# Patient Record
Sex: Female | Born: 1935 | Hispanic: No | Marital: Single
Health system: Southern US, Community
[De-identification: ages and names within clinical notes are randomized; demographics above are authoritative.]

## PROBLEM LIST (undated history)

## (undated) DIAGNOSIS — R569 Unspecified convulsions: Secondary | ICD-10-CM

## (undated) DIAGNOSIS — I10 Essential (primary) hypertension: Secondary | ICD-10-CM

---

## 2018-09-01 ENCOUNTER — Emergency Department (HOSPITAL_COMMUNITY): Payer: Self-pay

## 2018-09-01 ENCOUNTER — Emergency Department (HOSPITAL_COMMUNITY)
Admission: EM | Admit: 2018-09-01 | Discharge: 2018-09-02 | Disposition: A | Discharge status: Home / Self Care | Payer: Self-pay | Attending: Emergency Medicine | Admitting: Emergency Medicine

## 2018-09-01 ENCOUNTER — Other Ambulatory Visit: Payer: Self-pay

## 2018-09-01 ENCOUNTER — Encounter (HOSPITAL_COMMUNITY): Payer: Self-pay | Admitting: Emergency Medicine

## 2018-09-01 DIAGNOSIS — Y92008 Other place in unspecified non-institutional (private) residence as the place of occurrence of the external cause: Secondary | ICD-10-CM | POA: Insufficient documentation

## 2018-09-01 DIAGNOSIS — W19XXXA Unspecified fall, initial encounter: Secondary | ICD-10-CM

## 2018-09-01 DIAGNOSIS — S0083XA Contusion of other part of head, initial encounter: Secondary | ICD-10-CM | POA: Insufficient documentation

## 2018-09-01 DIAGNOSIS — I10 Essential (primary) hypertension: Secondary | ICD-10-CM | POA: Insufficient documentation

## 2018-09-01 DIAGNOSIS — W07XXXA Fall from chair, initial encounter: Secondary | ICD-10-CM | POA: Insufficient documentation

## 2018-09-01 DIAGNOSIS — S60221A Contusion of right hand, initial encounter: Secondary | ICD-10-CM | POA: Insufficient documentation

## 2018-09-01 DIAGNOSIS — Y9389 Activity, other specified: Secondary | ICD-10-CM | POA: Insufficient documentation

## 2018-09-01 DIAGNOSIS — Y999 Unspecified external cause status: Secondary | ICD-10-CM | POA: Insufficient documentation

## 2018-09-01 HISTORY — DX: Essential (primary) hypertension: I10

## 2018-09-01 HISTORY — DX: Unspecified convulsions: R56.9

## 2018-09-01 LAB — CBC WITH DIFFERENTIAL/PLATELET
ABS IMMATURE GRANULOCYTES: 0.03 10*3/uL (ref 0.00–0.07)
BASOS PCT: 1 %
Basophils Absolute: 0 10*3/uL (ref 0.0–0.1)
EOS ABS: 0.2 10*3/uL (ref 0.0–0.5)
Eosinophils Relative: 3 %
HCT: 38.7 % (ref 36.0–46.0)
Hemoglobin: 12.9 g/dL (ref 12.0–15.0)
Immature Granulocytes: 0 %
Lymphocytes Relative: 33 %
Lymphs Abs: 2.3 10*3/uL (ref 0.7–4.0)
MCH: 30.6 pg (ref 26.0–34.0)
MCHC: 33.3 g/dL (ref 30.0–36.0)
MCV: 91.9 fL (ref 80.0–100.0)
MONO ABS: 0.6 10*3/uL (ref 0.1–1.0)
MONOS PCT: 9 %
NEUTROS ABS: 3.8 10*3/uL (ref 1.7–7.7)
Neutrophils Relative %: 54 %
PLATELETS: 197 10*3/uL (ref 150–400)
RBC: 4.21 MIL/uL (ref 3.87–5.11)
RDW: 12.6 % (ref 11.5–15.5)
WBC: 7 10*3/uL (ref 4.0–10.5)
nRBC: 0 % (ref 0.0–0.2)

## 2018-09-01 LAB — COMPREHENSIVE METABOLIC PANEL
ALK PHOS: 96 U/L (ref 38–126)
ALT: 20 U/L (ref 0–44)
ANION GAP: 8 (ref 5–15)
AST: 24 U/L (ref 15–41)
Albumin: 3.4 g/dL — ABNORMAL LOW (ref 3.5–5.0)
BILIRUBIN TOTAL: 0.3 mg/dL (ref 0.3–1.2)
BUN: 13 mg/dL (ref 8–23)
CALCIUM: 8.6 mg/dL — AB (ref 8.9–10.3)
CO2: 28 mmol/L (ref 22–32)
Chloride: 103 mmol/L (ref 98–111)
Creatinine, Ser: 0.7 mg/dL (ref 0.44–1.00)
GFR calc Af Amer: >60 mL/min (ref >60)
GLUCOSE: 158 mg/dL — AB (ref 70–99)
POTASSIUM: 3.2 mmol/L — AB (ref 3.5–5.1)
Sodium: 139 mmol/L (ref 135–145)
TOTAL PROTEIN: 6.2 g/dL — AB (ref 6.5–8.1)

## 2018-09-01 LAB — URINALYSIS, ROUTINE W REFLEX MICROSCOPIC
Bilirubin Urine: NEGATIVE
GLUCOSE, UA: NEGATIVE mg/dL
HGB URINE DIPSTICK: NEGATIVE
Ketones, ur: NEGATIVE mg/dL
Leukocytes, UA: NEGATIVE
Nitrite: NEGATIVE
PROTEIN: NEGATIVE mg/dL
Specific Gravity, Urine: 1.003 — ABNORMAL LOW (ref 1.005–1.030)
pH: 7 (ref 5.0–8.0)

## 2018-09-01 LAB — I-STAT TROPONIN, ED: TROPONIN I, POC: 0 ng/mL (ref 0.00–0.08)

## 2018-09-01 NOTE — ED Provider Notes (Signed)
[facility]-EMERGENCY DEPT Provider Note   CSN: [id number] Arrival date & time: 09/01/18  1913     History   Chief Complaint Chief Complaint  Patient presents with  . Fall    HPI Keaunna Skipper is a 82 y.o. female.  The history is provided by the patient and a relative. No language interpreter was used.   Tyrina Hines is a 82 y.o. female who presents to the Emergency Department complaining of fall.  Level V caveat due to language barrier and confusion. History is provided by the patient's niece. The patient was getting up off of her couch and using her hand to support her when the couch slipped and she fell forward, hitting her head on the ground. The patient reports pain to her face and right hand. No nausea, loss of consciousness. Her knee states that she has been seeming a little bit confused stating that there is an election this weekend as well as saying that she forgot to have the roommate feed the dogs when the roommate has already fed the dogs. On ED arrival the patient is at her baseline. No reports of recent illnesses. She does have a history of hypertension, seizures and PVD. She is on pletal. She is followed by vascular surgery and PCP at [facility]. Past Medical History:  Diagnosis Date  . Hypertension   . Seizures (HCC)     There are no active problems to display for this patient.      OB History   None      Home Medications    Prior to Admission medications   Not on File    Family History No family history on file.  Social History Social History   Tobacco Use  . Smoking status: Not on file  Substance Use Topics  . Alcohol use: Not on file  . Drug use: Not on file     Allergies   Patient has no known allergies.   Review of Systems Review of Systems  All other systems reviewed and are negative.    Physical Exam Updated Vital Signs BP (!) 159/81 (BP Location: Right Arm) Comment: Pt. has history of high  BP and took her medications in the morning. Told the nurse about pts. high BP  Pulse 73   Temp 97.7 F (36.5 C) (Oral)   Resp 18   SpO2 97%   Physical Exam  Constitutional: She is oriented to person, place, and time. She appears well-developed and well-nourished.  HENT:  Head: Normocephalic.  Erythema and tenderness to the right cheek  Cardiovascular: Normal rate and regular rhythm.  No murmur heard. Pulmonary/Chest: Effort normal and breath sounds normal. No respiratory distress.  Abdominal: Soft. There is no tenderness. There is no rebound and no guarding.  Musculoskeletal:  Scarring and deformity to the left shin.  Neurological: She is alert and oriented to person, place, and time.  Five out of five strength in all four extremities with sensation to light touch intact in all four extremities. No facial asymmetry.  Skin: Skin is warm and dry.  Psychiatric: She has a normal mood and affect. Her behavior is normal.  Nursing note and vitals reviewed.    ED Treatments / Results  Labs (all labs ordered are listed, but only abnormal results are displayed) Labs Reviewed  COMPREHENSIVE METABOLIC PANEL - Abnormal; Notable for the following components:      Result Value   Potassium 3.2 (*)    Glucose, Bld 158 (*)  Calcium 8.6 (*)    Total Protein 6.2 (*)    Albumin 3.4 (*)    All other components within normal limits  URINALYSIS, ROUTINE W REFLEX MICROSCOPIC - Abnormal; Notable for the following components:   Color, Urine COLORLESS (*)    Specific Gravity, Urine 1.003 (*)    All other components within normal limits  CBC WITH DIFFERENTIAL/PLATELET  I-STAT TROPONIN, ED    EKG EKG Interpretation  Date/Time:  Friday September 01 2018 21:04:16 EDT Ventricular Rate:  82 PR Interval:    QRS Duration: 86 QT Interval:  388 QTC Calculation: 453 R Axis:   -76 Text Interpretation:  Atrial fibrillation Left axis deviation T wave abnormality, consider anterior ischemia Abnormal ECG  no prior available for comparison Confirmed by Tilden Fossa [phone]) on 09/01/2018 9:20:49 PM   Radiology Ct Head Wo Contrast  Result Date: 09/01/2018 CLINICAL DATA:  82 year old female with trauma. EXAM: CT HEAD WITHOUT CONTRAST CT MAXILLOFACIAL WITHOUT CONTRAST CT CERVICAL SPINE WITHOUT CONTRAST TECHNIQUE: Multidetector CT imaging of the head, cervical spine, and maxillofacial structures were performed using the standard protocol without intravenous contrast. Multiplanar CT image reconstructions of the cervical spine and maxillofacial structures were also generated. COMPARISON:  None. FINDINGS: CT HEAD FINDINGS Brain: Mild age-related atrophy and chronic microvascular ischemic changes. There is no acute intracranial hemorrhage. No mass effect or midline shift. No extra-axial fluid collection. Vascular: No hyperdense vessel or unexpected calcification. Skull: Normal. Negative for fracture or focal lesion. Other: None CT MAXILLOFACIAL FINDINGS Osseous: No fracture or mandibular dislocation. No destructive process. Orbits: Negative. No traumatic or inflammatory finding. Sinuses: Mild mucoperiosteal thickening of paranasal sinuses. No air-fluid levels. The mastoid air cells are clear. Soft tissues: Negative. CT CERVICAL SPINE FINDINGS Alignment: No acute subluxation. Skull base and vertebrae: No acute fracture. Osteopenia. Chronic changes with decreased vertebral body height. Soft tissues and spinal canal: No prevertebral fluid or swelling. No visible canal hematoma. Disc levels: Degenerative changes primarily at C5-C6 with endplate irregularity and disc space narrowing. Upper chest: Biapical subpleural scarring. A partially visualized nodular soft tissue density anterior to the right T5 vertebra measuring approximately 7 x 16 mm (series 9, image 97) is not well evaluated. Although this may represent a partially visualized paravertebral soft tissues or vasculature, a mass is not entirely excluded. This can be  further evaluated with a thoracic spine MRI on a nonemergent basis. There is heterogeneity of the thyroid gland. Other: None IMPRESSION: 1. No acute intracranial hemorrhage. Mild age-related atrophy and chronic microvascular ischemic changes. 2. No acute facial bone fractures. 3. No acute cervical spine fracture. 4. Partially visualized nodular soft tissue density anterior to the right T5 vertebra. Although this may represent a partially visualized paravertebral soft tissues or vasculature, a mass is not entirely excluded. This can be further evaluated with a thoracic spine MRI on a nonemergent basis. Electronically Signed   By: Elgie Collard M.D.   On: 09/01/2018 22:32   Ct Cervical Spine Wo Contrast  Result Date: 09/01/2018 CLINICAL DATA:  82 year old female with trauma. EXAM: CT HEAD WITHOUT CONTRAST CT MAXILLOFACIAL WITHOUT CONTRAST CT CERVICAL SPINE WITHOUT CONTRAST TECHNIQUE: Multidetector CT imaging of the head, cervical spine, and maxillofacial structures were performed using the standard protocol without intravenous contrast. Multiplanar CT image reconstructions of the cervical spine and maxillofacial structures were also generated. COMPARISON:  None. FINDINGS: CT HEAD FINDINGS Brain: Mild age-related atrophy and chronic microvascular ischemic changes. There is no acute intracranial hemorrhage. No mass effect or midline shift. No extra-axial  fluid collection. Vascular: No hyperdense vessel or unexpected calcification. Skull: Normal. Negative for fracture or focal lesion. Other: None CT MAXILLOFACIAL FINDINGS Osseous: No fracture or mandibular dislocation. No destructive process. Orbits: Negative. No traumatic or inflammatory finding. Sinuses: Mild mucoperiosteal thickening of paranasal sinuses. No air-fluid levels. The mastoid air cells are clear. Soft tissues: Negative. CT CERVICAL SPINE FINDINGS Alignment: No acute subluxation. Skull base and vertebrae: No acute fracture. Osteopenia. Chronic  changes with decreased vertebral body height. Soft tissues and spinal canal: No prevertebral fluid or swelling. No visible canal hematoma. Disc levels: Degenerative changes primarily at C5-C6 with endplate irregularity and disc space narrowing. Upper chest: Biapical subpleural scarring. A partially visualized nodular soft tissue density anterior to the right T5 vertebra measuring approximately 7 x 16 mm (series 9, image 97) is not well evaluated. Although this may represent a partially visualized paravertebral soft tissues or vasculature, a mass is not entirely excluded. This can be further evaluated with a thoracic spine MRI on a nonemergent basis. There is heterogeneity of the thyroid gland. Other: None IMPRESSION: 1. No acute intracranial hemorrhage. Mild age-related atrophy and chronic microvascular ischemic changes. 2. No acute facial bone fractures. 3. No acute cervical spine fracture. 4. Partially visualized nodular soft tissue density anterior to the right T5 vertebra. Although this may represent a partially visualized paravertebral soft tissues or vasculature, a mass is not entirely excluded. This can be further evaluated with a thoracic spine MRI on a nonemergent basis. Electronically Signed   By: Elgie Collard M.D.   On: 09/01/2018 22:32   Dg Hand Complete Right  Result Date: 09/01/2018 CLINICAL DATA:  Patient fell this am, pain first metacarpal area EXAM: RIGHT HAND - COMPLETE 3+ VIEW COMPARISON:  None. FINDINGS: There is no acute fracture or subluxation. There is degenerative change of the interphalangeal joints, particularly at the fourth proximal interphalangeal joint. No radiopaque foreign body or soft tissue gas. IMPRESSION: No evidence for acute  abnormality. Electronically Signed   By: Norva Pavlov M.D.   On: 09/01/2018 21:26   Ct Maxillofacial Wo Cm  Result Date: 09/01/2018 CLINICAL DATA:  82 year old female with trauma. EXAM: CT HEAD WITHOUT CONTRAST CT MAXILLOFACIAL WITHOUT  CONTRAST CT CERVICAL SPINE WITHOUT CONTRAST TECHNIQUE: Multidetector CT imaging of the head, cervical spine, and maxillofacial structures were performed using the standard protocol without intravenous contrast. Multiplanar CT image reconstructions of the cervical spine and maxillofacial structures were also generated. COMPARISON:  None. FINDINGS: CT HEAD FINDINGS Brain: Mild age-related atrophy and chronic microvascular ischemic changes. There is no acute intracranial hemorrhage. No mass effect or midline shift. No extra-axial fluid collection. Vascular: No hyperdense vessel or unexpected calcification. Skull: Normal. Negative for fracture or focal lesion. Other: None CT MAXILLOFACIAL FINDINGS Osseous: No fracture or mandibular dislocation. No destructive process. Orbits: Negative. No traumatic or inflammatory finding. Sinuses: Mild mucoperiosteal thickening of paranasal sinuses. No air-fluid levels. The mastoid air cells are clear. Soft tissues: Negative. CT CERVICAL SPINE FINDINGS Alignment: No acute subluxation. Skull base and vertebrae: No acute fracture. Osteopenia. Chronic changes with decreased vertebral body height. Soft tissues and spinal canal: No prevertebral fluid or swelling. No visible canal hematoma. Disc levels: Degenerative changes primarily at C5-C6 with endplate irregularity and disc space narrowing. Upper chest: Biapical subpleural scarring. A partially visualized nodular soft tissue density anterior to the right T5 vertebra measuring approximately 7 x 16 mm (series 9, image 97) is not well evaluated. Although this may represent a partially visualized paravertebral soft tissues or vasculature, a  mass is not entirely excluded. This can be further evaluated with a thoracic spine MRI on a nonemergent basis. There is heterogeneity of the thyroid gland. Other: None IMPRESSION: 1. No acute intracranial hemorrhage. Mild age-related atrophy and chronic microvascular ischemic changes. 2. No acute facial  bone fractures. 3. No acute cervical spine fracture. 4. Partially visualized nodular soft tissue density anterior to the right T5 vertebra. Although this may represent a partially visualized paravertebral soft tissues or vasculature, a mass is not entirely excluded. This can be further evaluated with a thoracic spine MRI on a nonemergent basis. Electronically Signed   By: Elgie Collard M.D.   On: 09/01/2018 22:32    Procedures Procedures (including critical care time)  Medications Ordered in ED Medications - No data to display   Initial Impression / Assessment and Plan / ED Course  I have reviewed the triage vital signs and the nursing notes.  Pertinent labs & imaging results that were available during my care of the patient were reviewed by me and considered in my medical decision making (see chart for details).     Patient with history of peripheral vascular disease here for evaluation of injuries following a mechanical fall. Family report that she was mildly confused prior to ED arrival but now at her baseline. Presentation is not consistent with CVA, sepsis. Labs demonstrate mild hypokalemia. Imaging does note indeterminate lesion near T5. Patient is asymptomatic. Plan to refer for outpatient follow-up. EKG does note atrial arrhythmia mixed with sinus rhythm, question paroxysmal atrial fibrillation. Patient is rate controlled and asymptomatic. No report of history of a fib. Plan to discharge home with close PCP follow-up and return precautions.  Final Clinical Impressions(s) / ED Diagnoses   Final diagnoses:  None    ED Discharge Orders    None       Tilden Fossa, MD 09/02/18 0011

## 2018-09-01 NOTE — ED Triage Notes (Addendum)
Patient BIB niece, states patient reports fall and c/o right side facial pain after hitting face on the floor. Niece reports patient is "saying a lot of nonsense." Patient denies LOC. Denies taking blood thinners. Patient is spanish speaking.

## 2018-09-02 MED ORDER — POTASSIUM CHLORIDE CRYS ER 20 MEQ PO TBCR
20.0000 meq | EXTENDED_RELEASE_TABLET | Freq: Once | ORAL | Status: AC
  Administered 2018-09-02: 20 meq via ORAL
  Filled 2018-09-02: qty 1

## 2018-09-02 NOTE — Discharge Instructions (Addendum)
Please follow up with Ms. Baugh family doctor regarding her fall.  She had a CT scan today that showed an irregularity in her back - please have this evaluated by her family doctor.  Her EKG today possible atrial fibrillation - please have her follow up with family doctor for recheck.  She can take tylenol for pain, available over the counter.

## 2018-10-27 ENCOUNTER — Emergency Department (HOSPITAL_COMMUNITY): Payer: Self-pay

## 2018-10-27 ENCOUNTER — Emergency Department (HOSPITAL_COMMUNITY)
Admission: EM | Admit: 2018-10-27 | Discharge: 2018-10-27 | Disposition: A | Discharge status: Home / Self Care | Payer: Self-pay | Attending: Emergency Medicine | Admitting: Emergency Medicine

## 2018-10-27 DIAGNOSIS — Z79899 Other long term (current) drug therapy: Secondary | ICD-10-CM | POA: Insufficient documentation

## 2018-10-27 DIAGNOSIS — F32A Depression, unspecified: Secondary | ICD-10-CM

## 2018-10-27 DIAGNOSIS — E860 Dehydration: Secondary | ICD-10-CM | POA: Insufficient documentation

## 2018-10-27 DIAGNOSIS — F329 Major depressive disorder, single episode, unspecified: Secondary | ICD-10-CM | POA: Insufficient documentation

## 2018-10-27 LAB — COMPREHENSIVE METABOLIC PANEL
ALT: 18 U/L (ref 0–44)
AST: 20 U/L (ref 15–41)
Albumin: 3.5 g/dL (ref 3.5–5.0)
Alkaline Phosphatase: 98 U/L (ref 38–126)
Anion gap: 11 (ref 5–15)
BUN: 14 mg/dL (ref 8–23)
CO2: 24 mmol/L (ref 22–32)
Calcium: 8.9 mg/dL (ref 8.9–10.3)
Chloride: 105 mmol/L (ref 98–111)
Creatinine, Ser: 0.67 mg/dL (ref 0.44–1.00)
GFR calc Af Amer: >60 mL/min (ref >60)
GFR calc non Af Amer: >60 mL/min (ref >60)
GLUCOSE: 117 mg/dL — AB (ref 70–99)
Potassium: 3.8 mmol/L (ref 3.5–5.1)
Sodium: 140 mmol/L (ref 135–145)
Total Bilirubin: 0.4 mg/dL (ref 0.3–1.2)
Total Protein: 6.5 g/dL (ref 6.5–8.1)

## 2018-10-27 LAB — URINALYSIS, ROUTINE W REFLEX MICROSCOPIC
Bilirubin Urine: NEGATIVE
Glucose, UA: NEGATIVE mg/dL
Hgb urine dipstick: NEGATIVE
Ketones, ur: NEGATIVE mg/dL
Leukocytes, UA: NEGATIVE
Nitrite: NEGATIVE
PH: 7 (ref 5.0–8.0)
Protein, ur: NEGATIVE mg/dL
Specific Gravity, Urine: 1.008 (ref 1.005–1.030)

## 2018-10-27 LAB — PROTIME-INR
INR: 1.01
PROTHROMBIN TIME: 13.2 s (ref 11.4–15.2)

## 2018-10-27 LAB — CBC WITH DIFFERENTIAL/PLATELET
Abs Immature Granulocytes: 0.03 10*3/uL (ref 0.00–0.07)
BASOS PCT: 0 %
Basophils Absolute: 0 10*3/uL (ref 0.0–0.1)
Eosinophils Absolute: 0.1 10*3/uL (ref 0.0–0.5)
Eosinophils Relative: 1 %
HCT: 42.1 % (ref 36.0–46.0)
Hemoglobin: 14.1 g/dL (ref 12.0–15.0)
Immature Granulocytes: 0 %
Lymphocytes Relative: 21 %
Lymphs Abs: 2.1 10*3/uL (ref 0.7–4.0)
MCH: 30.3 pg (ref 26.0–34.0)
MCHC: 33.5 g/dL (ref 30.0–36.0)
MCV: 90.3 fL (ref 80.0–100.0)
MONOS PCT: 5 %
Monocytes Absolute: 0.5 10*3/uL (ref 0.1–1.0)
Neutro Abs: 7.3 10*3/uL (ref 1.7–7.7)
Neutrophils Relative %: 73 %
Platelets: 193 10*3/uL (ref 150–400)
RBC: 4.66 MIL/uL (ref 3.87–5.11)
RDW: 12 % (ref 11.5–15.5)
WBC: 10.1 10*3/uL (ref 4.0–10.5)
nRBC: 0 % (ref 0.0–0.2)

## 2018-10-27 LAB — PHENYTOIN LEVEL, TOTAL: Phenytoin Lvl: <2.5 ug/mL — ABNORMAL LOW (ref 10.0–20.0)

## 2018-10-27 LAB — CBG MONITORING, ED: Glucose-Capillary: 117 mg/dL — ABNORMAL HIGH (ref 70–99)

## 2018-10-27 MED ORDER — HYDROCHLOROTHIAZIDE 25 MG PO TABS
25.0000 mg | ORAL_TABLET | Freq: Two times a day (BID) | ORAL | Status: DC
  Administered 2018-10-27: 25 mg via ORAL
  Filled 2018-10-27: qty 1

## 2018-10-27 MED ORDER — ALUM & MAG HYDROXIDE-SIMETH 200-200-20 MG/5ML PO SUSP: 30.0000 mL | Freq: Four times a day (QID) | ORAL | Status: DC | PRN

## 2018-10-27 MED ORDER — ACETAMINOPHEN 325 MG PO TABS
650.0000 mg | ORAL_TABLET | ORAL | Status: DC | PRN
  Administered 2018-10-27: 650 mg via ORAL
  Filled 2018-10-27: qty 2

## 2018-10-27 MED ORDER — PHENYTOIN SODIUM EXTENDED 100 MG PO CAPS
100.0000 mg | ORAL_CAPSULE | Freq: Two times a day (BID) | ORAL | Status: DC
  Administered 2018-10-27: 100 mg via ORAL
  Filled 2018-10-27: qty 1

## 2018-10-27 MED ORDER — ONDANSETRON HCL 4 MG PO TABS: 4.0000 mg | ORAL_TABLET | Freq: Three times a day (TID) | ORAL | Status: DC | PRN

## 2018-10-27 MED ORDER — SODIUM CHLORIDE 0.9 % IV SOLN
INTRAVENOUS | Status: DC
  Administered 2018-10-27: 22:00:00 via INTRAVENOUS

## 2018-10-27 MED ORDER — IBUPROFEN 800 MG PO TABS: 800.0000 mg | ORAL_TABLET | Freq: Four times a day (QID) | ORAL | Status: DC | PRN

## 2018-10-27 MED ORDER — SODIUM CHLORIDE 0.9 % IV BOLUS
1000.0000 mL | Freq: Once | INTRAVENOUS | Status: AC
  Administered 2018-10-27: 1000 mL via INTRAVENOUS

## 2018-10-27 MED ORDER — CILOSTAZOL 100 MG PO TABS
100.0000 mg | ORAL_TABLET | Freq: Two times a day (BID) | ORAL | Status: DC
  Administered 2018-10-27: 100 mg via ORAL
  Filled 2018-10-27: qty 1

## 2018-10-27 MED ORDER — LOSARTAN POTASSIUM 50 MG PO TABS: 50.0000 mg | ORAL_TABLET | Freq: Every day | ORAL | Status: DC
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
## 2018-10-27 NOTE — ED Notes (Signed)
ED Provider at bedside. 
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
## 2018-10-27 NOTE — ED Notes (Signed)
Patient transported to CT 

## 2020-10-10 ENCOUNTER — Emergency Department (HOSPITAL_COMMUNITY)
Admission: EM | Admit: 2020-10-10 | Discharge: 2020-10-11 | Disposition: A | Discharge status: Home / Self Care | Payer: Self-pay | Attending: Emergency Medicine | Admitting: Emergency Medicine

## 2020-10-10 ENCOUNTER — Encounter (HOSPITAL_COMMUNITY): Payer: Self-pay | Admitting: *Deleted

## 2020-10-10 ENCOUNTER — Emergency Department (HOSPITAL_COMMUNITY): Payer: Self-pay

## 2020-10-10 DIAGNOSIS — K219 Gastro-esophageal reflux disease without esophagitis: Secondary | ICD-10-CM | POA: Insufficient documentation

## 2020-10-10 DIAGNOSIS — K567 Ileus, unspecified: Secondary | ICD-10-CM | POA: Insufficient documentation

## 2020-10-10 DIAGNOSIS — Z79899 Other long term (current) drug therapy: Secondary | ICD-10-CM | POA: Insufficient documentation

## 2020-10-10 DIAGNOSIS — N3090 Cystitis, unspecified without hematuria: Secondary | ICD-10-CM | POA: Insufficient documentation

## 2020-10-10 DIAGNOSIS — I10 Essential (primary) hypertension: Secondary | ICD-10-CM | POA: Insufficient documentation

## 2020-10-10 DIAGNOSIS — N3000 Acute cystitis without hematuria: Secondary | ICD-10-CM

## 2020-10-10 LAB — CBC
HCT: 38.8 % (ref 36.0–46.0)
Hemoglobin: 13.4 g/dL (ref 12.0–15.0)
MCH: 30.9 pg (ref 26.0–34.0)
MCHC: 34.5 g/dL (ref 30.0–36.0)
MCV: 89.6 fL (ref 80.0–100.0)
Platelets: 200 10*3/uL (ref 150–400)
RBC: 4.33 MIL/uL (ref 3.87–5.11)
RDW: 12.1 % (ref 11.5–15.5)
WBC: 9.5 10*3/uL (ref 4.0–10.5)
nRBC: 0 % (ref 0.0–0.2)

## 2020-10-10 LAB — TROPONIN I (HIGH SENSITIVITY): Troponin I (High Sensitivity): 7 ng/L (ref <18)

## 2020-10-10 LAB — COMPREHENSIVE METABOLIC PANEL
ALT: 17 U/L (ref 0–44)
AST: 23 U/L (ref 15–41)
Albumin: 3.5 g/dL (ref 3.5–5.0)
Alkaline Phosphatase: 63 U/L (ref 38–126)
Anion gap: 12 (ref 5–15)
BUN: 18 mg/dL (ref 8–23)
CO2: 25 mmol/L (ref 22–32)
Calcium: 8.9 mg/dL (ref 8.9–10.3)
Chloride: 96 mmol/L — ABNORMAL LOW (ref 98–111)
Creatinine, Ser: 0.88 mg/dL (ref 0.44–1.00)
GFR, Estimated: >60 mL/min (ref >60)
Glucose, Bld: 130 mg/dL — ABNORMAL HIGH (ref 70–99)
Potassium: 3.4 mmol/L — ABNORMAL LOW (ref 3.5–5.1)
Sodium: 133 mmol/L — ABNORMAL LOW (ref 135–145)
Total Bilirubin: 1.2 mg/dL (ref 0.3–1.2)
Total Protein: 6.7 g/dL (ref 6.5–8.1)

## 2020-10-10 LAB — URINALYSIS, ROUTINE W REFLEX MICROSCOPIC
Bilirubin Urine: NEGATIVE
Glucose, UA: NEGATIVE mg/dL
Hgb urine dipstick: NEGATIVE
Ketones, ur: 20 mg/dL — AB
Nitrite: NEGATIVE
Protein, ur: NEGATIVE mg/dL
Specific Gravity, Urine: 1.014 (ref 1.005–1.030)
pH: 5 (ref 5.0–8.0)

## 2020-10-10 LAB — LIPASE, BLOOD: Lipase: 26 U/L (ref 11–51)

## 2020-10-10 NOTE — ED Notes (Signed)
Pt niece called on the phone stating that the patient has had chest pain, and this is why she called EMS. Spoke to the patient, brought back to triage for re-eval, pt says she has also been having generalized chest pain "for 15 days" and some shortness of breath.

## 2020-10-10 NOTE — ED Triage Notes (Signed)
Pt with constipation. Onset of vomiting and abdominal pain.

## 2020-10-10 NOTE — ED Triage Notes (Signed)
Pt is from home via [facility], per their report, speaks only spanish. Has had constipation for 4 days, abdominal pain for 3 days. 140/70, hr 80, 100% RA, RR 18. Alert and oriented.

## 2020-10-11 ENCOUNTER — Emergency Department (HOSPITAL_COMMUNITY): Payer: Self-pay

## 2020-10-11 ENCOUNTER — Other Ambulatory Visit: Payer: Self-pay

## 2020-10-11 LAB — TROPONIN I (HIGH SENSITIVITY): Troponin I (High Sensitivity): 7 ng/L (ref <18)

## 2020-10-11 MED ORDER — MORPHINE SULFATE (PF) 2 MG/ML IV SOLN
2.0000 mg | Freq: Once | INTRAVENOUS | Status: AC
  Administered 2020-10-11: 2 mg via INTRAVENOUS
  Filled 2020-10-11: qty 1

## 2020-10-11 MED ORDER — SODIUM CHLORIDE 0.9 % IV BOLUS
500.0000 mL | Freq: Once | INTRAVENOUS | Status: AC
  Administered 2020-10-11: 500 mL via INTRAVENOUS

## 2020-10-11 MED ORDER — OMEPRAZOLE 40 MG PO CPDR: 40.0000 mg | DELAYED_RELEASE_CAPSULE | Freq: Every day | ORAL | 0 refills | Status: AC

## 2020-10-11 MED ORDER — ALUM & MAG HYDROXIDE-SIMETH 200-200-20 MG/5ML PO SUSP
30.0000 mL | Freq: Once | ORAL | Status: AC
  Administered 2020-10-11: 30 mL via ORAL
  Filled 2020-10-11: qty 30

## 2020-10-11 MED ORDER — IOHEXOL 300 MG/ML  SOLN
100.0000 mL | Freq: Once | INTRAMUSCULAR | Status: AC | PRN
  Administered 2020-10-11: 100 mL via INTRAVENOUS

## 2020-10-11 MED ORDER — CEPHALEXIN 500 MG PO CAPS: 500.0000 mg | ORAL_CAPSULE | Freq: Two times a day (BID) | ORAL | 0 refills | Status: AC

## 2020-10-11 MED ORDER — LIDOCAINE VISCOUS HCL 2 % MT SOLN
15.0000 mL | Freq: Once | OROMUCOSAL | Status: AC
  Administered 2020-10-11: 15 mL via ORAL
  Filled 2020-10-11: qty 15

## 2020-10-11 NOTE — ED Notes (Signed)
Pt given water to drink, tolerated well, denies nausea or increased abd pain, EDP made aware

## 2020-10-11 NOTE — Discharge Instructions (Addendum)
You were seen in the emergency department for abdominal pain, constipation.  You had a bowel movement in the emergency department and symptoms resolved after medicines.  Also you reported some burning in your stomach, regurgitation and burning in your throat with some meals.  Urinalysis today showed a urinary tract infection.  Take cephalexin twice a day for 7 days.  Return to the emergency department if you have fever greater than 100, worsening urinary symptoms, worsening abdominal pain  CT scan showed ileus which is slowing or partial paralysis of the gut, this can cause constipation.  Take 500 to 1000 mg of acetaminophen as needed, at most every 6 hours for abdominal pain.  Avoid ibuprofen products as this can cause more stomach irritation.  Liquid diet for the next 24 hours, if your symptoms are better and you are tolerating liquid diet you can transition to a gentle diet for the following 24/48 hours.  If symptoms have completely resolved in the next 2 to 3 days you may return to your regular diet.  Avoid any greasy, large or fatty meals, acidic foods, alcohol  Follow-up with primary care doctor in the next week to make sure your symptoms have improved  Return to the ED for fever greater than 100, nausea, vomiting, worsening or constant or severe abdominal pain, inability to pass gas or have a bowel movement

## 2020-10-11 NOTE — ED Provider Notes (Signed)
[facility] EMERGENCY DEPARTMENT Provider Note   CSN: [id number] Arrival date & time: 10/10/20  1934     History Chief Complaint  Patient presents with  . Abdominal Pain    Brooke Fuentes is a 84 y.o. female with history of hypertension, seizures on Dilantin, PAD, left lower extremity wounds presents to the ED for evaluation of abdominal pain that began on Tuesday.  Generalized, constant, moderate.  Associated with constipation.  States she had not had a bowel movement since Tuesday.  She had 4 episodes of vomiting on Thursday night.  States that it was bright red/bloody.  Does admit that earlier that day she had a red apple and had rice, eggs and tomato sauce before this episode of vomiting.  Has not vomited since then. No longer feeling nauseous. Reports "burning" pain in the epigastrium occasionally after some meals with regurgitation and burning in the back of her throat.  Does not take anything for acid reflux.  Per triage note patient reporting chest pain.  When asked about this patient points to her epigastrium.  She had a natural remedy yesterday and states she had a large watery bowel movement overnight while waiting in the ED waiting room.  Still reports ongoing but milder abdominal discomfort.  Has noticed she has been urinating less and has noticed blood in her urine.  No dysuria, hematuria, frequency.  Reports uterus surgery many years ago but does not know the specifics.  Denies fever, exertional chest pain or shortness of breath.  No other interventions.    HPI     Past Medical History:  Diagnosis Date  . Hypertension   . Seizures (HCC)     There are no problems to display for this patient.   ** The histories are not reviewed yet. Please review them in the "History" navigator section and refresh this SmartLink.   OB History   No obstetric history on file.     No family history on file.  Social History   Tobacco Use  . Smoking status: Never  Smoker  Substance Use Topics  . Alcohol use: Not Currently  . Drug use: Not Currently    Home Medications Prior to Admission medications   Medication Sig Start Date End Date Taking? Authorizing Provider  acetaminophen (TYLENOL) 325 MG tablet Take 325-650 mg by mouth every 6 (six) hours as needed (for pain).    [provider]  cephALEXin (KEFLEX) 500 MG capsule Take 1 capsule (500 mg total) by mouth 2 (two) times daily for 7 days. 10/11/20 10/18/20  Liberty Handy, PA-C  cilostazol (PLETAL) 100 MG tablet Take 100 mg by mouth 2 (two) times daily. 08/09/17   [provider]  hydrochlorothiazide (HYDRODIURIL) 25 MG tablet Take 25 mg by mouth 2 (two) times daily. 05/29/18 05/29/19  [provider]  ibuprofen (ADVIL,MOTRIN) 200 MG tablet Take 800 mg by mouth every 6 (six) hours as needed (for pain).    [provider]  losartan (COZAAR) 50 MG tablet Take 50 mg by mouth daily. 05/29/18 05/29/19  [provider]  omeprazole (PRILOSEC) 40 MG capsule Take 1 capsule (40 mg total) by mouth daily. 10/11/20 11/10/20  Liberty Handy, PA-C  phenytoin (DILANTIN) 100 MG ER capsule Take 100 mg by mouth 2 (two) times daily. 05/30/18   [provider]    Allergies    Patient has no known allergies.  Review of Systems   Review of Systems  Gastrointestinal: Positive for abdominal pain, constipation,  nausea and vomiting.  Genitourinary: Positive for hematuria.  All other systems reviewed and are negative.   Physical Exam Updated Vital Signs BP (!) 143/92   Pulse 69   Temp 97.7 F (36.5 C) (Oral)   Resp 15   Ht 5' (1.524 m)   Wt 63.5 kg   SpO2 95%   BMI 27.34 kg/m   Physical Exam Vitals and nursing note reviewed.  Constitutional:      Appearance: She is well-developed.     Comments: Non toxic in NAD  HENT:     Head: Normocephalic and atraumatic.     Nose: Nose normal.  Eyes:     Conjunctiva/sclera: Conjunctivae normal.  Cardiovascular:      Rate and Rhythm: Normal rate and regular rhythm.  Pulmonary:     Effort: Pulmonary effort is normal.     Breath sounds: Normal breath sounds.  Abdominal:     General: Bowel sounds are normal.     Palpations: Abdomen is soft.     Tenderness: There is generalized abdominal tenderness (epigastrium, suprapubic).     Comments: Generalized tenderness, most significant TTP in suprapubic and epigastrium area. No CVAT. ?Distention/firmness lower abdomen diffusely.  No G/R/R. Negative Murphy's and McBurney's.   Musculoskeletal:        General: Normal range of motion.     Cervical back: Normal range of motion.  Skin:    General: Skin is warm and dry.     Capillary Refill: Capillary refill takes less than 2 seconds.  Neurological:     Mental Status: She is alert.  Psychiatric:        Behavior: Behavior normal.     ED Results / Procedures / Treatments   Labs (all labs ordered are listed, but only abnormal results are displayed) Labs Reviewed  COMPREHENSIVE METABOLIC PANEL - Abnormal; Notable for the following components:      Result Value   Sodium 133 (*)    Potassium 3.4 (*)    Chloride 96 (*)    Glucose, Bld 130 (*)    All other components within normal limits  URINALYSIS, ROUTINE W REFLEX MICROSCOPIC - Abnormal; Notable for the following components:   Ketones, ur 20 (*)    Leukocytes,Ua MODERATE (*)    Bacteria, UA RARE (*)    All other components within normal limits  URINE CULTURE  LIPASE, BLOOD  CBC  TROPONIN I (HIGH SENSITIVITY)  TROPONIN I (HIGH SENSITIVITY)    EKG EKG Interpretation  Date/Time:  Friday October 10 2020 21:21:58 EST Ventricular Rate:  93 PR Interval:  198 QRS Duration: 80 QT Interval:  352 QTC Calculation: 437 R Axis:   -129 Text Interpretation: Normal sinus rhythm Right superior axis deviation Pulmonary disease pattern Right ventricular hypertrophy Abnormal ECG When compared with ECG of 09/01/2018, No significant change was found Confirmed by  Dione Booze ([id number]) on 10/11/2020 3:15:45 AM   Radiology DG Chest 2 View  Result Date: 10/10/2020 CLINICAL DATA:  Chest pain EXAM: CHEST - 2 VIEW COMPARISON:  None. FINDINGS: Frontal and lateral views of the chest demonstrate unremarkable cardiac silhouette. The lungs are hyperinflated with increased interstitial prominence compatible with emphysema. No airspace disease, effusion, or pneumothorax. No acute bony abnormalities. IMPRESSION: 1. Emphysema.  No acute process. Electronically Signed   By: Sharlet Salina M.D.   On: 10/10/2020 22:00   CT ABDOMEN PELVIS W CONTRAST  Result Date: 10/11/2020 CLINICAL DATA:  Abdominal pain and constipation. EXAM: CT ABDOMEN AND PELVIS WITH CONTRAST TECHNIQUE: Multidetector  CT imaging of the abdomen and pelvis was performed using the standard protocol following bolus administration of intravenous contrast. CONTRAST:  100mL OMNIPAQUE IOHEXOL 300 MG/ML  SOLN COMPARISON:  None. FINDINGS: Lower chest: Bibasilar pulmonary scarring. Calcified mitral valve annulus. Hepatobiliary: No focal liver abnormality is seen. No gallstones, gallbladder wall thickening, or biliary dilatation. Pancreas: Unremarkable. No pancreatic ductal dilatation or surrounding inflammatory changes. Spleen: Normal in size without focal abnormality. Adrenals/Urinary Tract: Adrenal glands are unremarkable. Kidneys are normal, without renal calculi, focal lesion, or hydronephrosis. Bilateral simple renal cysts appear benign. Bladder is unremarkable. Stomach/Bowel: Diverticulosis of the sigmoid colon without CT evidence of acute diverticulitis. The appendix is normal. No evidence of acute bowel obstruction. There is some scattered fluid in nondilated small bowel loops which may be indicative of enteritis or mild ileus involving small bowel. No free intraperitoneal air is identified. Vascular/Lymphatic: Aortic and iliac atherosclerosis without evidence of aneurysmal disease. 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 An enlarged left internal iliac trunk supplies collateral arteries to the left thigh. Reproductive: Status post hysterectomy. No adnexal masses. Other: No abdominal wall hernia or abnormality. No focal abscess or ascites. Musculoskeletal: Degenerative disc disease of the lower lumbar spine with visible posterior disc protrusions, especially at the L5-S1 level. IMPRESSION: 1. 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 An enlarged left internal iliac trunk supplies collateral arteries to the left thigh. 2. Scattered fluid in nondilated small bowel loops may be indicative of enteritis or mild ileus involving small bowel. 3. Diverticulosis of the sigmoid colon without CT evidence of acute diverticulitis. 4. Aortic atherosclerosis without aneurysm. Aortic Atherosclerosis (ICD10-I70.0). Electronically Signed   By: Irish LackGlenn  Yamagata M.D.   On: 10/11/2020 11:24    Procedures Procedures (including critical care time)  Medications Ordered in ED Medications  sodium chloride 0.9 % bolus 500 mL (0 mLs Intravenous Stopped 10/11/20 1103)  morphine 2 MG/ML injection 2 mg (2 mg Intravenous Given 10/11/20 0953)  alum & mag hydroxide-simeth (MAALOX/MYLANTA) 200-200-20 MG/5ML suspension 30 mL (30 mLs Oral Given 10/11/20 0954)    And  lidocaine (XYLOCAINE) 2 % viscous mouth solution 15 mL (15 mLs Oral Given 10/11/20 0954)  iohexol (OMNIPAQUE) 300 MG/ML solution 100 mL (100 mLs Intravenous Contrast Given 10/11/20 1058)    ED Course  I have reviewed the triage vital signs and the nursing notes.  Pertinent labs & imaging results that were available during my care of the patient were reviewed by me and considered in my medical decision making (see chart for details).  Clinical Course as of Oct 11 1348   Sat Oct 11, 2020  [id number] IMPRESSION: 1. Emphysema. No acute process.     DG Chest 2 View [CG]  [phone] Normal sinus rhythm Right superior axis deviation Pulmonary disease pattern Right ventricular hypertrophy Abnormal ECG When compared with ECG of 09/01/2018, No significant change was found Confirmed by Dione BoozeGlick, David ([id number]) on 10/11/2020 3:15:45 AM  ED EKG [CG]  0923  IMPRESSION: 1. Emphysema. No acute process.    DG Chest 2 View [CG]  0923 WBC: 9.5 [CG]  0923 Hemoglobin: 13.4 [CG]  0923 Potassium(!): 3.4 [CG]  0923 Glucose(!): 130 [CG]  0923 Anion gap: 12 [CG]  0923 Troponin I (High Sensitivity): 7 [CG]  1142 IMPRESSION: 1. Chronic occlusion and atresia  of the entire left external iliac artery and common femoral artery as well as the femoral bifurcation on the left. An enlarged left internal iliac trunk supplies collateral arteries to the left thigh. 2. Scattered fluid in nondilated small bowel loops may be indicative of enteritis or mild ileus involving small bowel. 3. Diverticulosis of the sigmoid colon without CT evidence of acute diverticulitis. 4. Aortic atherosclerosis without aneurysm.    CT ABDOMEN PELVIS W CONTRAST [CG]  1246 Reevaluated patient.  Reports complete resolution of abdominal pain after GI cocktail and IV medicines.  She is hungry and would like to eat.  Son-in-law at bedside also provides some history which corroborates with what she told me initially.   [CG]    Clinical Course User Index [CG] Jerrell Mylar   MDM Rules/Calculators/A&P                          84 year old female presents for abdominal pain, constipation since Tuesday.  Reports generalized abdominal pain but on exam has more focal epigastric and suprapubic tenderness.  Somewhat firm abdomen but no rebound, guarding, rigidity or fluid wave.  Had natural remedies last night and had large volume stool out in the waiting room.  ER work-up initiated in triage specifically for  chest pain however when I asked patient about chest pain she points to her epigastrium and describes some postprandial "burning" in her abdomen, regurgitation and burning in the throat after meals.  She does not take any acid reflux medicines.  Labs, imaging ordered as above  ER work-up personally visualized and interpreted  Labs reveal UA with moderate leukocytes, 11-20 WBC, rare bacteria, this is a good urine sample.  Patient reports hematuria and has suprapubic abdominal pain.  We will treat for UTI with Keflex.  K3.4, glucose 130.  LFTs, lipase, WBC all normal.  Troponin x2 undetectable.  Imaging reveals chronic changes consistent with emphysema.  Of note patient denies any smoking but does state she gets recurrent coughing "gripe" (flu) occasionally with weather changes but denies any specific or previous diagnosis of COPD, asthma.  Currently denies fevers, cough, chest pain or shortness of breath.  CT A/P reveals enteritis versus ileus.  Clinical presentation is most consistent with ileus that has resolved.  Medicines ordered: GI cocktail, 2 mg morphine, small IV fluid bolus.  Patient has been reevaluated and has had several serial abdominal exams.  She reports complete resolution of her abdominal pain.  Is passing gas.  Tolerating food/water here in the ED without any nausea or vomiting.  Clinical presentation is most consistent with resolved ileus, will dc with liquid diet and slow transition to gentle diet as tolerated.  She does describe some chronic gastritis/GERD/acid reflux issues.  Will discharge with omeprazole, diet modifications and symptomatic management of ileus.  Keflex for UTI. Discussed plan with son-in-law at bedside who is comfortable with discharge plan.  Discussed patient with EDP who agrees with ER management and disposition.  Strict return precautions given to patient.  Recommended PCP follow-up in 1 week. Final Clinical Impression(s) / ED Diagnoses Final diagnoses:   Ileus (HCC)  Acute cystitis without hematuria  Gastroesophageal reflux disease without esophagitis    Rx / DC Orders ED Discharge Orders         Ordered    omeprazole (PRILOSEC) 40 MG capsule  Daily        10/11/20 1349    cephALEXin (KEFLEX) 500 MG capsule  2 times daily  10/11/20 1349           Liberty Handy, PA-C 10/11/20 1349    Lorre Nick, MD 10/13/20 1341

## 2020-10-12 LAB — URINE CULTURE

## 2021-07-09 IMAGING — CT CT ABD-PELV W/ CM
2 of 5 series · 16 of 46 positions shown, 18 images · IV contrast (omnipaque)
Comparison: None.

CLINICAL DATA: Abdominal pain and constipation.

EXAM:
CT ABDOMEN AND PELVIS WITH CONTRAST
TECHNIQUE: Multidetector CT imaging of the abdomen and pelvis was performed
using the standard protocol following bolus administration of
intravenous contrast.
CONTRAST:  100mL OMNIPAQUE IOHEXOL 300 MG/ML  SOLN

[Series 3: abdomen 5.0 · axial · 0.67mm/px · z∈[-385,-50]mm · 13 of 79 slices shown, 15 images]
[im 6/79  soft-tissue]
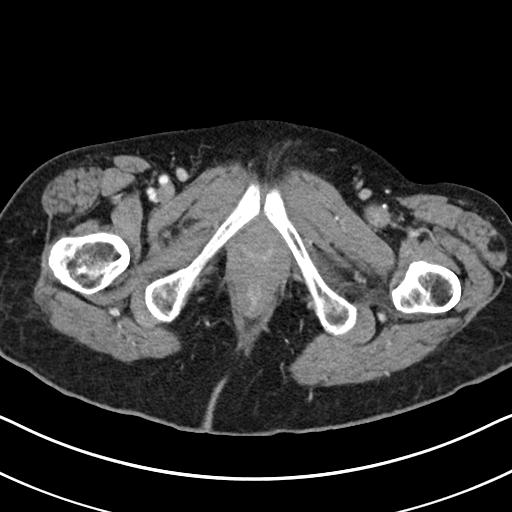
[im 6/79  bone]
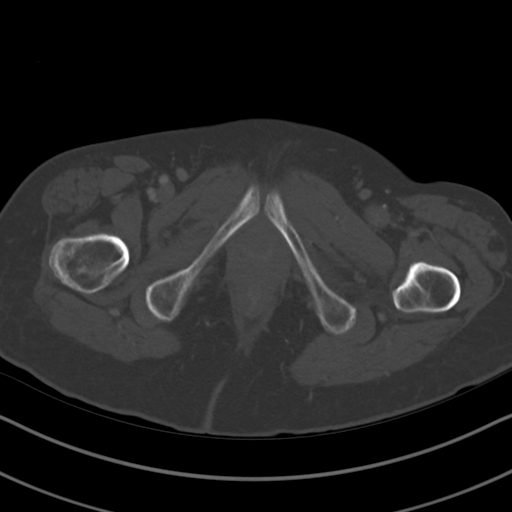
[im 12/79  soft-tissue]
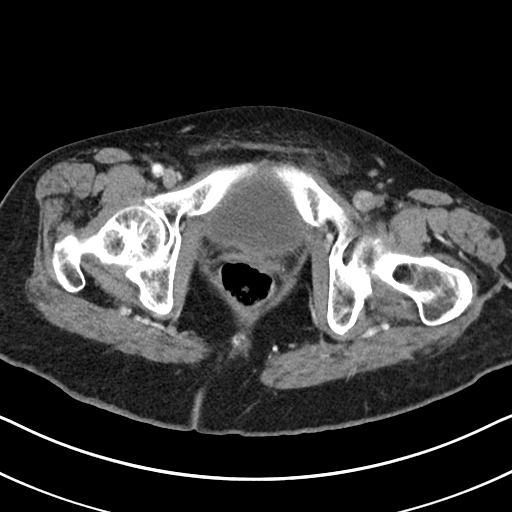
[im 17/79  soft-tissue]
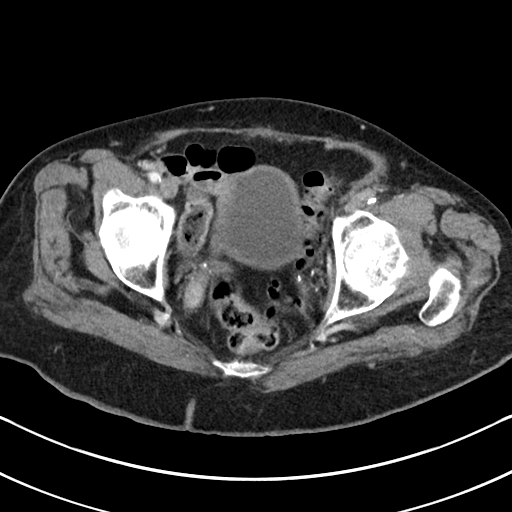
[im 23/79  soft-tissue]
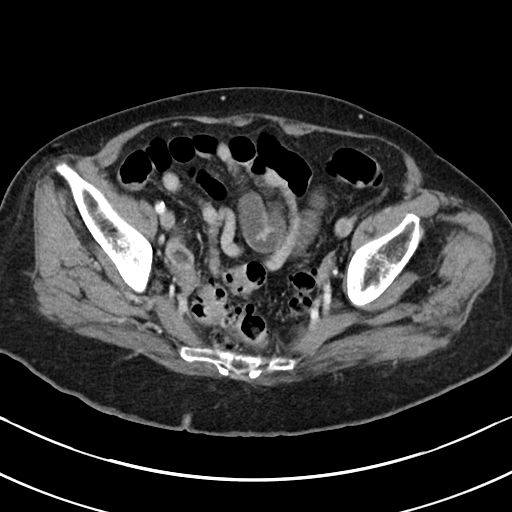
[im 28/79  soft-tissue]
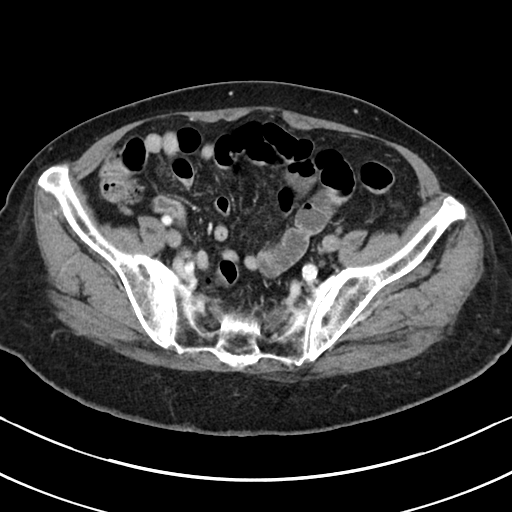
[im 34/79  soft-tissue]
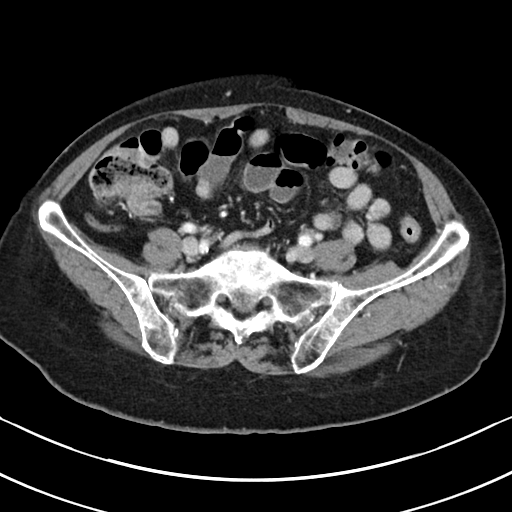
[im 40/79  soft-tissue]
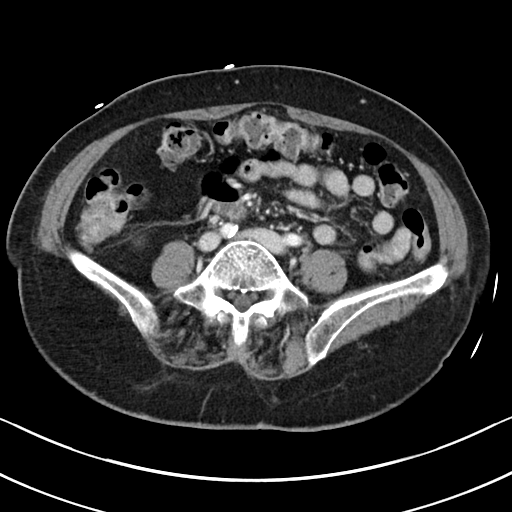
[im 45/79  soft-tissue]
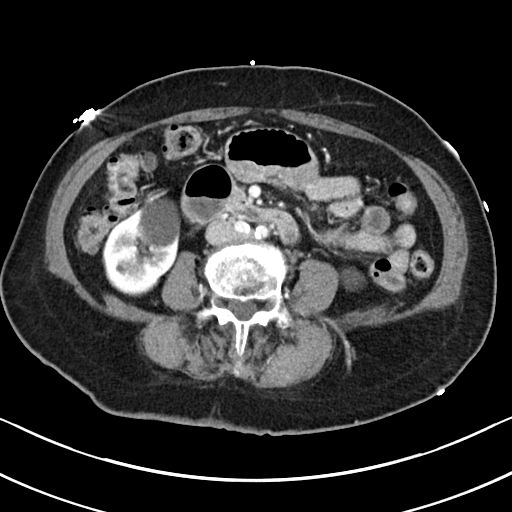
[im 51/79  soft-tissue]
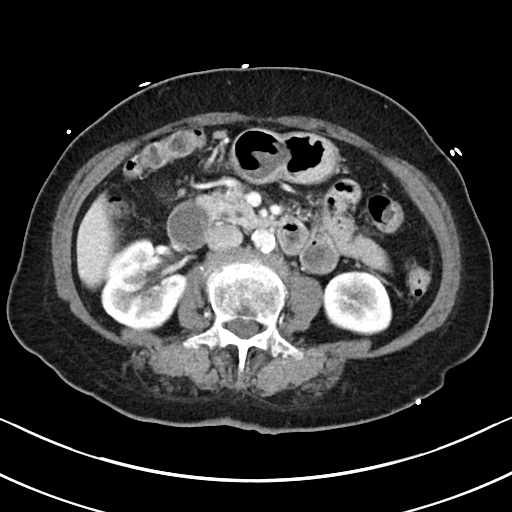
[im 51/79  bone]
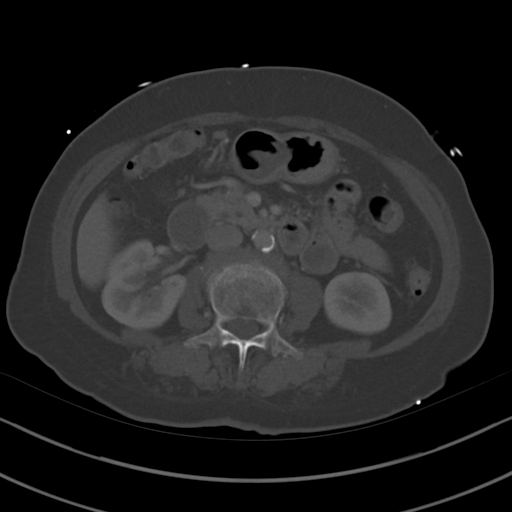
[im 56/79  soft-tissue]
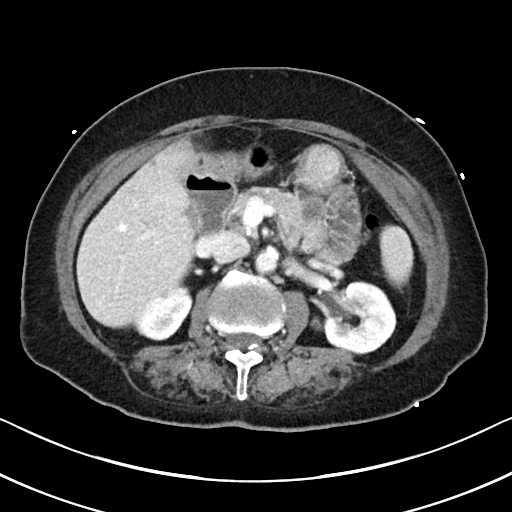
[im 62/79  soft-tissue]
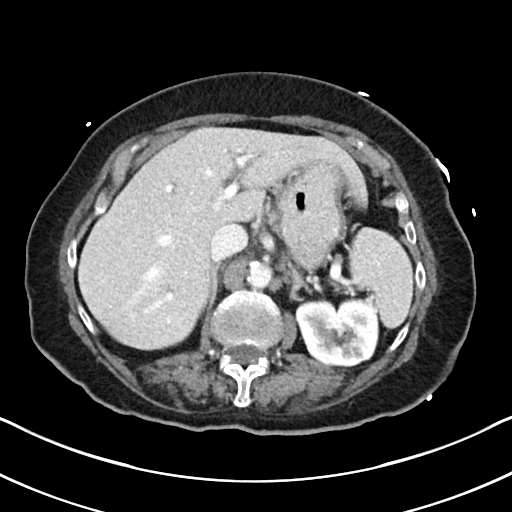
[im 67/79  soft-tissue]
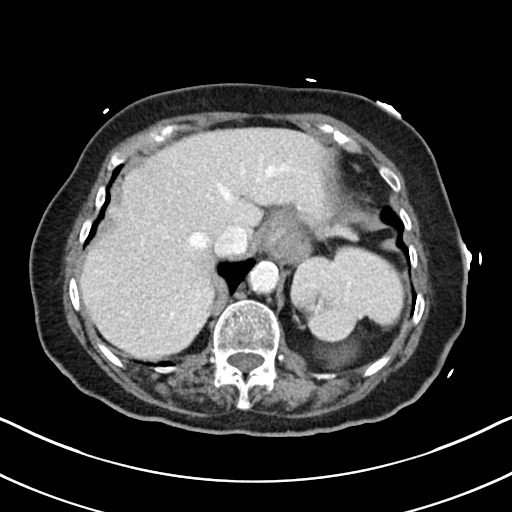
[im 73/79  soft-tissue]
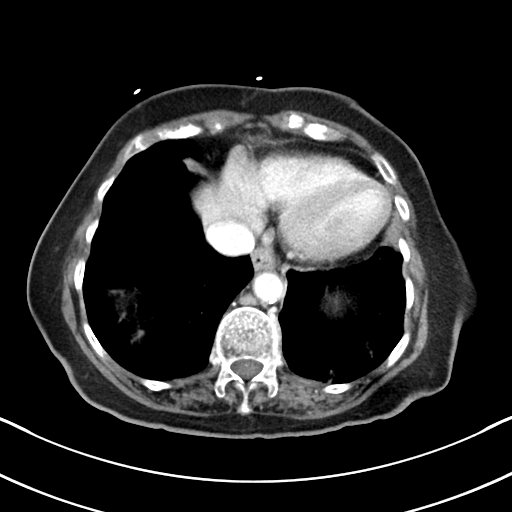

[Series 6: abdomen 3.0 mpr cor · coronal · 0.79mm/px · 3 of 86 slices shown]
[im 29/86  soft-tissue]
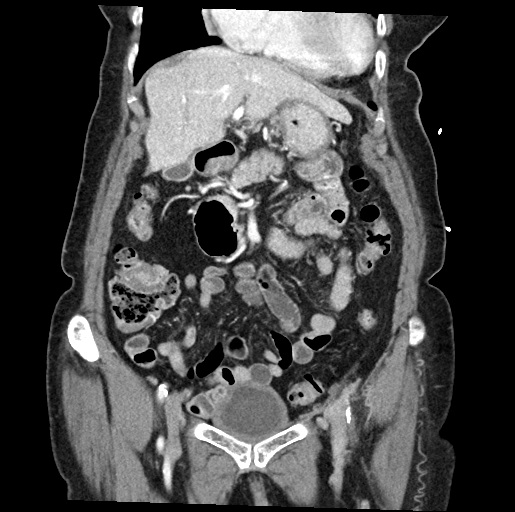
[im 38/86  soft-tissue]
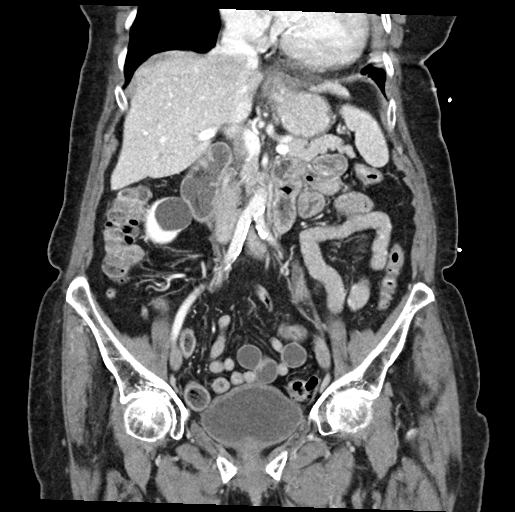
[im 48/86  soft-tissue]
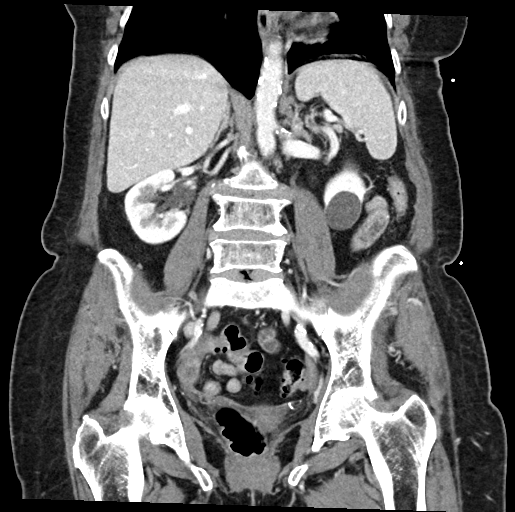

[16 of 46 positions shown; findings below may reference images not displayed]

FINDINGS: Lower chest: Bibasilar pulmonary scarring. Calcified mitral valve
annulus.

Hepatobiliary: No focal liver abnormality is seen. No gallstones,
gallbladder wall thickening, or biliary dilatation.

Pancreas: Unremarkable. No pancreatic ductal dilatation or
surrounding inflammatory changes.

Spleen: Normal in size without focal abnormality.

Adrenals/Urinary Tract: Adrenal glands are unremarkable. Kidneys are
normal, without renal calculi, focal lesion, or hydronephrosis.
Bilateral simple renal cysts appear benign. Bladder is unremarkable.

Stomach/Bowel: Diverticulosis of the sigmoid colon without CT
evidence of acute diverticulitis. The appendix is normal. No
evidence of acute bowel obstruction. There is some scattered fluid
in nondilated small bowel loops which may be indicative of enteritis
or mild ileus involving small bowel. No free intraperitoneal air is
identified.

Vascular/Lymphatic: Aortic and iliac atherosclerosis without
evidence of aneurysmal disease. There is chronic occlusion and
atresia of the entire left external iliac artery and common femoral
artery as well as the femoral bifurcation on the left. An enlarged
left internal iliac trunk supplies collateral arteries to the left
thigh.

Reproductive: Status post hysterectomy. No adnexal masses.

Other: No abdominal wall hernia or abnormality. No focal abscess or
ascites.

Musculoskeletal: Degenerative disc disease of the lower lumbar spine
with visible posterior disc protrusions, especially at the L5-S1
level.
IMPRESSION: 1. Chronic occlusion and atresia of the entire left external iliac
artery and common femoral artery as well as the femoral bifurcation
on the left. An enlarged left internal iliac trunk supplies
collateral arteries to the left thigh.
2. Scattered fluid in nondilated small bowel loops may be indicative
of enteritis or mild ileus involving small bowel.
3. Diverticulosis of the sigmoid colon without CT evidence of acute
diverticulitis.
4. Aortic atherosclerosis without aneurysm.

Aortic Atherosclerosis ([id number].L).
